# Patient Record
Sex: Female | Born: 2000 | Hispanic: Yes | Marital: Single | Smoking: Never smoker
Health system: Southern US, Community
[De-identification: ages and names within clinical notes are randomized; demographics above are authoritative.]

## PROBLEM LIST (undated history)

## (undated) DIAGNOSIS — J45909 Unspecified asthma, uncomplicated: Secondary | ICD-10-CM

## (undated) HISTORY — PX: ADENOIDECTOMY AND MYRINGOTOMY WITH TUBE PLACEMENT: SHX5714

---
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
## 2023-03-29 ENCOUNTER — Emergency Department (HOSPITAL_BASED_OUTPATIENT_CLINIC_OR_DEPARTMENT_OTHER)
Admission: EM | Admit: 2023-03-29 | Discharge: 2023-03-29 | Disposition: A | Discharge status: Home / Self Care | Payer: BLUE CROSS/BLUE SHIELD | Attending: Emergency Medicine | Admitting: Emergency Medicine

## 2023-03-29 ENCOUNTER — Emergency Department (HOSPITAL_BASED_OUTPATIENT_CLINIC_OR_DEPARTMENT_OTHER): Payer: BLUE CROSS/BLUE SHIELD

## 2023-03-29 ENCOUNTER — Other Ambulatory Visit: Payer: Self-pay

## 2023-03-29 ENCOUNTER — Encounter (HOSPITAL_BASED_OUTPATIENT_CLINIC_OR_DEPARTMENT_OTHER): Payer: Self-pay

## 2023-03-29 DIAGNOSIS — O208 Other hemorrhage in early pregnancy: Secondary | ICD-10-CM | POA: Insufficient documentation

## 2023-03-29 DIAGNOSIS — N939 Abnormal uterine and vaginal bleeding, unspecified: Secondary | ICD-10-CM

## 2023-03-29 DIAGNOSIS — Z3A01 Less than 8 weeks gestation of pregnancy: Secondary | ICD-10-CM | POA: Diagnosis not present

## 2023-03-29 LAB — CBC WITH DIFFERENTIAL/PLATELET
Abs Immature Granulocytes: 0.01 10*3/uL (ref 0.00–0.07)
Basophils Absolute: 0 10*3/uL (ref 0.0–0.1)
Basophils Relative: 0 %
Eosinophils Absolute: 0 10*3/uL (ref 0.0–0.5)
Eosinophils Relative: 1 %
HCT: 36.3 % (ref 36.0–46.0)
Hemoglobin: 12.6 g/dL (ref 12.0–15.0)
Immature Granulocytes: 0 %
Lymphocytes Relative: 35 %
Lymphs Abs: 1.3 10*3/uL (ref 0.7–4.0)
MCH: 31.3 pg (ref 26.0–34.0)
MCHC: 34.7 g/dL (ref 30.0–36.0)
MCV: 90.1 fL (ref 80.0–100.0)
Monocytes Absolute: 0.3 10*3/uL (ref 0.1–1.0)
Monocytes Relative: 8 %
Neutro Abs: 2 10*3/uL (ref 1.7–7.7)
Neutrophils Relative %: 56 %
Platelets: 164 10*3/uL (ref 150–400)
RBC: 4.03 MIL/uL (ref 3.87–5.11)
RDW: 11.8 % (ref 11.5–15.5)
WBC: 3.6 10*3/uL — ABNORMAL LOW (ref 4.0–10.5)
nRBC: 0 % (ref 0.0–0.2)

## 2023-03-29 LAB — COMPREHENSIVE METABOLIC PANEL
ALT: 20 U/L (ref 0–44)
AST: 21 U/L (ref 15–41)
Albumin: 4.1 g/dL (ref 3.5–5.0)
Alkaline Phosphatase: 28 U/L — ABNORMAL LOW (ref 38–126)
Anion gap: 7 (ref 5–15)
BUN: <5 mg/dL — ABNORMAL LOW (ref 6–20)
CO2: 27 mmol/L (ref 22–32)
Calcium: 9.2 mg/dL (ref 8.9–10.3)
Chloride: 104 mmol/L (ref 98–111)
Creatinine, Ser: 0.53 mg/dL (ref 0.44–1.00)
GFR, Estimated: >60 mL/min (ref >60)
Glucose, Bld: 84 mg/dL (ref 70–99)
Potassium: 3.6 mmol/L (ref 3.5–5.1)
Sodium: 138 mmol/L (ref 135–145)
Total Bilirubin: 0.4 mg/dL (ref <1.2)
Total Protein: 7 g/dL (ref 6.5–8.1)

## 2023-03-29 LAB — HCG, QUANTITATIVE, PREGNANCY: hCG, Beta Chain, Quant, S: 96636 m[IU]/mL — ABNORMAL HIGH (ref <5)

## 2023-03-29 NOTE — ED Triage Notes (Signed)
Pt c/o vaginal bleeding, states she is [redacted]wks pregnant. Denies soaking through pads, advises it is "more like spotting." States it started last night "& it was lighter, but it's gotten heavier since."  Advises she is flu+  Seen at [facility] for bleeding, sent for further eval.

## 2023-03-29 NOTE — ED Provider Notes (Signed)
[facility]PART[facility] AT [facility] Provider Note   CSN: [id number] Arrival date & time: 03/29/23  1203     History Chief Complaint  Patient presents with   Vaginal Bleeding   Threatened Miscarriage    Vanessa Mendoza is a 22 y.o. female patient who is G1 P0 Ab0 approximately 8 weeks by ultrasound that was performed last week is coming in today with vaginal bleeding and lower abdominal pain.  Symptoms started last night and her vaginal bleeding was initially lighter but has since gotten a little heavier.  Patient was seen at urgent care was sent here for further evaluation.  Of note, patient is flu positive.   Vaginal Bleeding      Home Medications Prior to Admission medications   Medication Sig Start Date End Date Taking? Authorizing Provider  oseltamivir (TAMIFLU) 75 MG capsule Take 75 mg by mouth 2 (two) times daily. 03/26/23 03/31/23 Yes [provider]  azithromycin (ZITHROMAX) 250 MG tablet Take 2 tabs PO QD on Day 1 then 1 tab PO QD on days 2-5 04/10/14   Lowanda Foster, NP  dextromethorphan-guaiFENesin [facility] DM) 30-600 MG per 12 hr tablet Take 1 tablet by mouth 2 (two) times daily. X 2-4 days then prn 04/10/14   Lowanda Foster, NP  ondansetron (ZOFRAN ODT) 8 MG disintegrating tablet Take 1 tablet (8 mg total) by mouth every 8 (eight) hours as needed for nausea or vomiting. 08/05/13   Excell Seltzer, Adrian Blackwater, PA-C  polyethylene glycol [facility]) packet Take 17 g by mouth 2 (two) times daily. 08/05/13   Graylon Good, PA-C      Allergies    Penicillins    Review of Systems   Review of Systems  Genitourinary:  Positive for vaginal bleeding.  All other systems reviewed and are negative.   Physical Exam Updated Vital Signs BP 124/89 (BP Location: Right Arm)   Pulse 78   Temp 98.2 F (36.8 C) (Oral)   Resp 16   SpO2 98%  Physical Exam Vitals and nursing note reviewed.  Constitutional:      General: She is not in acute distress.     Appearance: Normal appearance.  HENT:     Head: Normocephalic and atraumatic.  Eyes:     General:        Right eye: No discharge.        Left eye: No discharge.  Cardiovascular:     Comments: Regular rate and rhythm.  S1/S2 are distinct without any evidence of murmur, rubs, or gallops.  Radial pulses are 2+ bilaterally.  Dorsalis pedis pulses are 2+ bilaterally.  No evidence of pedal edema. Pulmonary:     Comments: Clear to auscultation bilaterally.  Normal effort.  No respiratory distress.  No evidence of wheezes, rales, or rhonchi heard throughout. Abdominal:     General: Abdomen is flat. Bowel sounds are normal. There is no distension.     Tenderness: There is abdominal tenderness. There is no guarding or rebound.     Comments: Diffuse lower abdominal tenderness.  Musculoskeletal:        General: Normal range of motion.     Cervical back: Neck supple.  Skin:    General: Skin is warm and dry.     Findings: No rash.  Neurological:     General: No focal deficit present.     Mental Status: She is alert.  Psychiatric:        Mood and Affect: Mood normal.  Behavior: Behavior normal.     ED Results / Procedures / Treatments   Labs (all labs ordered are listed, but only abnormal results are displayed) Labs Reviewed  HCG, QUANTITATIVE, PREGNANCY - Abnormal; Notable for the following components:      Result Value   hCG, Beta Chain, Quant, S [id number] (*)    All other components within normal limits  CBC WITH DIFFERENTIAL/PLATELET - Abnormal; Notable for the following components:   WBC 3.6 (*)    All other components within normal limits  COMPREHENSIVE METABOLIC PANEL - Abnormal; Notable for the following components:   BUN <5 (*)    Alkaline Phosphatase 28 (*)    All other components within normal limits    EKG None  Radiology US OB LESS THAN 14 WEEKS WITH OB TRANSVAGINAL Result Date: 03/29/2023 CLINICAL DATA:  Vaginal bleeding in 1st trimester pregnancy. Threatened  abortion. EXAM: OBSTETRIC <14 WK [place] AND TRANSVAGINAL OB US TECHNIQUE: Both transabdominal and transvaginal ultrasound examinations were performed for complete evaluation of the gestation as well as the maternal uterus, adnexal regions, and pelvic cul-de-sac. Transvaginal technique was performed to assess early pregnancy. COMPARISON:  None Available. FINDINGS: Intrauterine gestational sac: Single Yolk sac:  Visualized. Embryo:  Visualized. Cardiac Activity: Visualized. Heart Rate: 176 bpm CRL:  14 mm   7 w   5 d                  [place] EDC: 11/10/2023 Subchorionic hemorrhage:  None visualized. Maternal uterus/adnexae: Both ovaries are normal in appearance. No mass or abnormal free fluid identified. IMPRESSION: Single living IUP with estimated gestational age of [redacted] weeks 5 days, and [place] EDC of 11/10/2023. Electronically Signed   By: Danae Orleans M.D.   On: 03/29/2023 15:25    Procedures Procedures    Medications Ordered in ED Medications - No data to display  ED Course/ Medical Decision Making/ A&P Clinical Course as of 03/29/23 1551  Thu Mar 29, 2023  1541 Comprehensive metabolic panel(!) Normal.  [CF]  1545 CBC with Differential(!) Normal.  [CF]  1546 hCG, quantitative, pregnancy(!) Trending to be 6-8 weeks. [CF]  1546 US OB LESS THAN 14 WEEKS WITH OB TRANSVAGINAL I personally ordered and interpreted the study and there is a single IUP that is viable.  I do agree with the radiologist interpretation. [CF]    Clinical Course User Index [CF] Teressa Lower, PA-C   {   Click here for ABCD2, HEART and other calculators  Medical Decision Making Vanessa Mendoza is a 22 y.o. female patient who presents to the emergency department today for further evaluation of potential threatened miscarriage.  Patient is having some vaginal bleeding and lower abdominal tenderness.  Her quant level is certainly elevated in the setting of vaginal bleeding abdominal pain we will get a ultrasound.  She is less than 14  weeks.  Also get basic labs well.  Overall, vital signs are normal at this time.  Labs are reassuring.  hCG quantitative does reveal gestation around 6 to 8 weeks.  Ultrasound does confirm viable fetus at approximately 7 weeks.  I will have her follow-up with her OB/GYN for further evaluation.  Strict turn precautions were discussed.  She is safe for discharge.  Amount and/or Complexity of Data Reviewed Labs: ordered. Decision-making details documented in ED Course. Radiology: ordered. Decision-making details documented in ED Course.   Final Clinical Impression(s) / ED Diagnoses Final diagnoses:  Vaginal bleeding    Rx / DC Orders ED  Discharge Orders     None         Honor Loh [place], [place] 03/29/23 1552    Benjiman Core, MD 04/02/23 [phone]

## 2023-03-29 NOTE — Discharge Instructions (Signed)
As we discussed, ultrasound I did feel that the baby is alive and doing well.  I would like for you to follow-up with your OB/GYN for further evaluation.  You may return to the emergency department for any worsening symptoms.

## 2023-03-30 ENCOUNTER — Inpatient Hospital Stay (HOSPITAL_COMMUNITY)
Admission: AD | Admit: 2023-03-30 | Discharge: 2023-03-30 | Disposition: A | Discharge status: Home / Self Care | Payer: BLUE CROSS/BLUE SHIELD | Attending: Obstetrics and Gynecology | Admitting: Obstetrics and Gynecology

## 2023-03-30 ENCOUNTER — Encounter (HOSPITAL_COMMUNITY): Payer: Self-pay | Admitting: Obstetrics and Gynecology

## 2023-03-30 ENCOUNTER — Inpatient Hospital Stay (HOSPITAL_COMMUNITY): Payer: BLUE CROSS/BLUE SHIELD

## 2023-03-30 DIAGNOSIS — O209 Hemorrhage in early pregnancy, unspecified: Secondary | ICD-10-CM | POA: Diagnosis present

## 2023-03-30 DIAGNOSIS — O039 Complete or unspecified spontaneous abortion without complication: Secondary | ICD-10-CM | POA: Insufficient documentation

## 2023-03-30 DIAGNOSIS — Z3A01 Less than 8 weeks gestation of pregnancy: Secondary | ICD-10-CM

## 2023-03-30 DIAGNOSIS — Z2913 Encounter for prophylactic Rho(D) immune globulin: Secondary | ICD-10-CM | POA: Diagnosis not present

## 2023-03-30 DIAGNOSIS — Z23 Encounter for immunization: Secondary | ICD-10-CM | POA: Diagnosis not present

## 2023-03-30 LAB — HCG, QUANTITATIVE, PREGNANCY: hCG, Beta Chain, Quant, S: 58272 m[IU]/mL — ABNORMAL HIGH (ref <5)

## 2023-03-30 LAB — ABO/RH
ABO/RH(D): A NEG
Antibody Screen: NEGATIVE

## 2023-03-30 MED ORDER — RHO D IMMUNE GLOBULIN 1500 UNIT/2ML IJ SOSY
300.0000 ug | PREFILLED_SYRINGE | Freq: Once | INTRAMUSCULAR | Status: AC
  Administered 2023-03-30: 300 ug via INTRAMUSCULAR
  Filled 2023-03-30: qty 2

## 2023-03-30 NOTE — Discharge Instructions (Addendum)
You were seen in the MAU for a miscarriage today. I am very sorry for your loss.  You may take 800mg  of Motrin (ibuprofen) every 8 hours as needed and 600mg  of Tylenol (acetominophen) every 6 hours as needed for pain. Return to MAU with heavy bleeding - soaking a pad in an hour or less or passing clots the size of lemon or larger.  You will need to be seen for a follow up ultrasound in 2 weeks to ensure that you have passed the pregnancy completely.

## 2023-03-30 NOTE — MAU Provider Note (Cosign Needed Addendum)
History     CSN: [id number]  Arrival date and time: 03/30/23 1722   Event Date/Time   First Provider Initiated Contact with Patient 03/30/23 1904      Chief Complaint  Patient presents with   Vaginal Bleeding   Abdominal Pain   HPI Patient presenting for evaluation for vaginal bleeding.  She is seen at [facility] yesterday for spotting.  Beta-hCG was 96,000, ultrasound showing single living IUP with estimated gestational age of [redacted] weeks and 5 days.  Patient reports that today the bleeding got worse so she laid around.  She got up and passed a large clot.  Denies any recent sexual activity.  Has had the flu so she has had cough, congestion, rhinorrhea, fever, chills.  Was flu +5 days ago.  OB History     Gravida  1   Para      Term      Preterm      AB      Living         SAB      IAB      Ectopic      Multiple      Live Births              Past Medical History:  Diagnosis Date   Asthma     Past Surgical History:  Procedure Laterality Date   ADENOIDECTOMY AND MYRINGOTOMY WITH TUBE PLACEMENT      History reviewed. No pertinent family history.  Social History   Tobacco Use   Smoking status: Never  Substance Use Topics   Alcohol use: Not Currently   Drug use: No    Allergies:  Allergies  Allergen Reactions   Penicillins     REACTION: Rash    Medications Prior to Admission  Medication Sig Dispense Refill Last Dose/Taking   oseltamivir (TAMIFLU) 75 MG capsule Take 75 mg by mouth 2 (two) times daily.   03/30/2023   azithromycin (ZITHROMAX) 250 MG tablet Take 2 tabs PO QD on Day 1 then 1 tab PO QD on days 2-5 6 tablet 0    dextromethorphan-guaiFENesin (MUCINEX DM) 30-600 MG per 12 hr tablet Take 1 tablet by mouth 2 (two) times daily. X 2-4 days then prn 15 tablet 0    ondansetron (ZOFRAN ODT) 8 MG disintegrating tablet Take 1 tablet (8 mg total) by mouth every 8 (eight) hours as needed for nausea or vomiting. 8 tablet 0     polyethylene glycol (MIRALAX) packet Take 17 g by mouth 2 (two) times daily. 14 each 0     Review of Systems  Constitutional:  Positive for chills and fever.  HENT:  Positive for congestion and rhinorrhea.   Eyes:  Negative for visual disturbance.  Respiratory:  Negative for shortness of breath.   Cardiovascular:  Negative for chest pain.  Gastrointestinal:  Positive for abdominal pain, constipation and nausea. Negative for diarrhea and vomiting.  Genitourinary:  Positive for vaginal bleeding and vaginal discharge.  Musculoskeletal:  Negative for myalgias.  Neurological:  Positive for headaches.   Physical Exam   Blood pressure (!) 127/90, pulse 88, temperature 98 F (36.7 C), temperature source Oral, resp. rate 16, height 5\' 2"  (1.575 m), weight 77.3 kg, SpO2 100%.  Physical Exam Vitals reviewed.  Constitutional:      Appearance: She is well-developed. She is obese.  HENT:     Head: Normocephalic and atraumatic.  Cardiovascular:     Rate and Rhythm: Normal rate and  regular rhythm.  Abdominal:     General: There is no distension.     Palpations: Abdomen is soft.     Tenderness: There is no abdominal tenderness.  Skin:    General: Skin is warm.     Capillary Refill: Capillary refill takes less than 2 seconds.  Neurological:     General: No focal deficit present.     Mental Status: She is alert.  Psychiatric:        Mood and Affect: Mood normal.    MAU Course  Procedures  MDM hCG quant ABO Urinalysis Ultrasound   Assessment and Plan  Vanessa Mendoza 22 year old G1 at 7 weeks and 6 days presenting for vaginal bleeding.  Miscarriage Patient presented for evaluation for vaginal bleeding.  Spotting yesterday and passing clots today.  hCG quant of 96,000 yesterday.  Repeat hCG quant, ABO, ultrasound ordered.  ABO was a negative so RhoGAM workup being run.  At the time of discharge ultrasound results and hCG quant were pending.  Handed off to oncoming provider Lamont Snowball who will complete patient's care.Clarita Crane [place] 03/30/2023, 9:10 PM   Assumed care from Dr. Nobie Putnam at 2010.   - Counseled on need for Rhogam due to A- blood type. RBA counseled. Patient agrees to administration. - Consulted Dr. Berton Lan, no need for misoprostol administration. Patient to follow up in 2 weeks at [facility] for follow up ultrasound. Message sent to office.  - Counseled on bleeding and infection precautions and to return to MAU PRN. - Discharged in stable condition.   Lamont Snowball, MSN, CNM, RNC-OB Certified Nurse Midwife, [facility] 03/30/2023 11:29 PM

## 2023-03-30 NOTE — MAU Note (Signed)
.  Vanessa Mendoza is a 22 y.o. at [redacted]w[redacted]d here in MAU reporting: Vaginal bleeding that began two days ago. She reports her bleeding was initially vaginal spotting. She reports she went to the restroom today and noted blood in her underwear. She reports she then sneezed and passed several large blood clots. She is also reporting intermittent lower abdominal cramping that began yesterday. Denies recent IC. Denies vaginal itching but endorses vaginal odor.  Tested positive for Flu A on 12/9 but has been symptomatic since 12/6.  [place] results from yesterday: Single living IUP with estimated gestational age of [redacted] weeks 5 days, and [place] EDC of 11/10/2023. Heart rate 176 bpm.  Onset of complaint: two days Pain score: 9/10 lower abdomen  Vitals:   03/30/23 1747  BP: (!) 127/90  Pulse: 88  Resp: 16  Temp: 98 F (36.7 C)  SpO2: 100%     FHT: n/a Lab orders placed from triage:  UA

## 2023-03-31 LAB — RH IG WORKUP (INCLUDES ABO/RH)
Gestational Age(Wks): 7
Unit division: 0

## 2023-04-19 ENCOUNTER — Telehealth: Payer: Self-pay | Admitting: *Deleted

## 2023-04-19 NOTE — Telephone Encounter (Addendum)
 Called pt to follow up and see how she is feeling after MAU visit on 12/13. Pt did not answer. A message was left on voicemail stating the reason for my call and requesting her to call back. When pt returns call, she needs to be informed of need for provider appt for f/u SAB @ one of the [facility] offices - need to know her desired location.

## 2023-04-23 NOTE — Telephone Encounter (Signed)
Call forwarded to voice mail
